# Patient Record
Sex: Female | Born: 1990 | Race: White | Hispanic: No | Marital: Single | Smoking: Never smoker
Health system: Southern US, Community
[De-identification: ages and names within clinical notes are randomized; demographics above are authoritative.]

## PROBLEM LIST (undated history)

## (undated) DIAGNOSIS — F419 Anxiety disorder, unspecified: Secondary | ICD-10-CM

## (undated) DIAGNOSIS — Z20828 Contact with and (suspected) exposure to other viral communicable diseases: Secondary | ICD-10-CM

## (undated) HISTORY — DX: Anxiety disorder, unspecified: F41.9

## (undated) HISTORY — PX: WISDOM TOOTH EXTRACTION: SHX21

## (undated) HISTORY — PX: OTHER SURGICAL HISTORY: SHX169

## (undated) HISTORY — DX: Contact with and (suspected) exposure to other viral communicable diseases: Z20.828

---
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
## 2013-07-13 ENCOUNTER — Encounter: Payer: Self-pay | Admitting: Obstetrics & Gynecology

## 2013-07-13 ENCOUNTER — Ambulatory Visit (INDEPENDENT_AMBULATORY_CARE_PROVIDER_SITE_OTHER): Payer: BC Managed Care – PPO | Admitting: Obstetrics & Gynecology

## 2013-07-13 VITALS — BP 100/72 | HR 64 | Resp 12 | Ht 68.0 in | Wt 147.4 lb

## 2013-07-13 DIAGNOSIS — Z01419 Encounter for gynecological examination (general) (routine) without abnormal findings: Secondary | ICD-10-CM

## 2013-07-13 MED ORDER — DROSPIREN-ETH ESTRAD-LEVOMEFOL 3-0.02-0.451 MG PO TABS: 1.0000 | ORAL_TABLET | Freq: Every day | ORAL | Status: DC

## 2013-07-13 NOTE — Patient Instructions (Signed)

## 2013-07-13 NOTE — Progress Notes (Signed)
22 y.o. G0P0000 SingleCaucasianF here for annual exam.  Reports she passed out in class in November.  Ended up in ER in [place], [place], and had lots of tests.  All were negative.  Nothing like that has happened since.  Rising sophomore.  Will apply to pharmacy end of this year.  With boyfriend 3 1/2 years.  He is starting at [employer] in the fall.  Did have a Pap with w/u in fall.  Was negative.   Patient's last menstrual period was 07/03/2013.          Sexually active: yes  The current method of family planning is OCP (estrogen/progesterone).    Exercising: yes  cardio and lifting weights Smoker:  no  Health Maintenance: Pap:  none History of abnormal Pap:  no MMG:  none Colonoscopy:  none BMD:   none TDaP:  3/11 Screening Labs: n/a, Hb today: n/a, Urine today: n/a   reports that she has never smoked. She has never used smokeless tobacco. She reports that she does not drink alcohol or use illicit drugs.  Past Medical History  Diagnosis Date  . Anxiety     Past Surgical History  Procedure Laterality Date  . Hymenal band excision    . Wisdom tooth extraction      Current Outpatient Prescriptions  Medication Sig Dispense Refill  . Drospirenone-Ethinyl Estradiol-Levomefol (BEYAZ) 3-0.02-0.451 MG tablet Take 1 tablet by mouth daily.       No current facility-administered medications for this visit.    Family History  Problem Relation Age of Onset  . Diabetes Paternal Grandmother   . Brain cancer Maternal Grandfather   . Hypertension Paternal Grandfather   . Hypertension Paternal Grandmother   . Non-Hodgkin's lymphoma Other     paternal great grandmother, then breast and cervical  . Leukemia Paternal Grandmother   . Hyperlipidemia Father     ROS:  Pertinent items are noted in HPI.  Otherwise, a comprehensive ROS was negative.  Exam:   BP 100/72  Pulse 64  Resp 12  Ht 5\' 8"  (1.727 m)  Wt 147 lb 6.4 oz (66.86 kg)  BMI 22.42 kg/m2  LMP 07/03/2013  Weight change: 10lb    Height: 5\' 8"  (172.7 cm)  Ht Readings from Last 3 Encounters:  07/13/13 5\' 8"  (1.727 m) (93%*, Z = 1.45)   * Growth percentiles are based on CDC 2-20 Years data.    General appearance: alert, cooperative and appears stated age Head: Normocephalic, without obvious abnormality, atraumatic Neck: no adenopathy, supple, symmetrical, trachea midline and thyroid normal to inspection and palpation Lungs: clear to auscultation bilaterally Breasts: normal appearance, no masses or tenderness Heart: regular rate and rhythm Abdomen: soft, non-tender; bowel sounds normal; no masses,  no organomegaly Extremities: extremities normal, atraumatic, no cyanosis or edema Skin: Skin color, texture, turgor normal. No rashes or lesions Lymph nodes: Cervical, supraclavicular, and axillary nodes normal. No abnormal inguinal nodes palpated Neurologic: Grossly normal   Pelvic: External genitalia:  no lesions              Urethra:  normal appearing urethra with no masses, tenderness or lesions              Bartholins and Skenes: normal                 Vagina: normal appearing vagina with normal color and discharge, no lesions              Cervix: no lesions  Pap taken: no Bimanual Exam:  Uterus:  normal size, contour, position, consistency, mobility, non-tender              Adnexa: normal adnexa and no mass, fullness, tenderness               Rectovaginal: Confirms               Anus:  normal sphincter tone, no lesions  A:  Well Woman with normal exam On OCPs  P:   Mammogram starting at 42  pap smear at 23.  Reports she had Pap and STD testing in November. Rx for [facility] Drought to pharmacy.  Lot [id number], exp 7/15, #7 boxes, lot [id number], exp 2/15, #5boxes return annually or prn  An After Visit Summary was printed and given to the patient.

## 2013-07-18 ENCOUNTER — Ambulatory Visit: Payer: Self-pay | Admitting: Obstetrics & Gynecology

## 2013-07-20 ENCOUNTER — Ambulatory Visit: Payer: Self-pay | Admitting: Obstetrics & Gynecology

## 2013-09-28 ENCOUNTER — Telehealth: Payer: Self-pay | Admitting: Obstetrics & Gynecology

## 2013-09-28 NOTE — Telephone Encounter (Signed)
Returned Call, confirmed that I was speaking with [facility] and caller confirmed care of this patient, [facility] [employer]. Provider was waiting for call back, continuation of care purposes.  Advised we do not have previous results of those tests.     Routing to provider for final review. Disposition completed, phone call with care provider. Will close encounter

## 2013-09-28 NOTE — Telephone Encounter (Signed)
Chief Complaint  Patient presents with   OFFICE CALLING REGARDING RESULTS  christy from [facility] calling to check if pt has hiv testing and rr testing done here.  phone number is [phone].

## 2013-12-02 ENCOUNTER — Emergency Department (HOSPITAL_BASED_OUTPATIENT_CLINIC_OR_DEPARTMENT_OTHER)
Admission: EM | Admit: 2013-12-02 | Discharge: 2013-12-02 | Disposition: A | Discharge status: Home / Self Care | Payer: BC Managed Care – PPO | Attending: Emergency Medicine | Admitting: Emergency Medicine

## 2013-12-02 ENCOUNTER — Encounter (HOSPITAL_BASED_OUTPATIENT_CLINIC_OR_DEPARTMENT_OTHER): Payer: Self-pay | Admitting: Emergency Medicine

## 2013-12-02 DIAGNOSIS — L739 Follicular disorder, unspecified: Secondary | ICD-10-CM

## 2013-12-02 DIAGNOSIS — Z79899 Other long term (current) drug therapy: Secondary | ICD-10-CM | POA: Insufficient documentation

## 2013-12-02 DIAGNOSIS — L738 Other specified follicular disorders: Secondary | ICD-10-CM | POA: Insufficient documentation

## 2013-12-02 DIAGNOSIS — Z8659 Personal history of other mental and behavioral disorders: Secondary | ICD-10-CM | POA: Insufficient documentation

## 2013-12-02 DIAGNOSIS — L678 Other hair color and hair shaft abnormalities: Secondary | ICD-10-CM | POA: Insufficient documentation

## 2013-12-02 NOTE — ED Notes (Addendum)
Pt reports rash since 11pm. Concerned that it may be scabies. Also reports she has been taking ibuprofen over the last week

## 2013-12-02 NOTE — ED Provider Notes (Signed)
CSN: [id number]     Arrival date & time 12/02/13  0357 History   First MD Initiated Contact with Patient 12/02/13 0447     Chief Complaint  Patient presents with  . Rash   (Consider location/radiation/quality/duration/timing/severity/associated sxs/prior Treatment) Patient is a 22 y.o. female presenting with rash. The history is provided by the patient.  Rash Location:  Shoulder/arm Shoulder/arm rash location:  R forearm Quality: not blistering, not bruising, not draining, not dry, not itchy, not painful and not swelling   Severity:  Mild Onset quality:  Sudden Timing:  Constant Progression:  Unchanged Chronicity:  New Context: not animal contact, not exposure to similar rash, not hot tub use and not plant contact   Relieved by:  Nothing Worsened by:  Nothing tried Ineffective treatments:  None tried Associated symptoms: no abdominal pain and no fever     Past Medical History  Diagnosis Date  . Anxiety    Past Surgical History  Procedure Laterality Date  . Hymenal band excision    . Wisdom tooth extraction     Family History  Problem Relation Age of Onset  . Diabetes Paternal Grandmother   . Brain cancer Maternal Grandfather   . Hypertension Paternal Grandfather   . Hypertension Paternal Grandmother   . Non-Hodgkin's lymphoma Other     paternal great grandmother, then breast and cervical  . Leukemia Paternal Grandmother   . Hyperlipidemia Father    History  Substance Use Topics  . Smoking status: Never Smoker   . Smokeless tobacco: Never Used  . Alcohol Use: No   OB History   Grav Para Term Preterm Abortions TAB SAB Ect Mult Living   0 0 0 0 0 0 0 0 0 0      Review of Systems  Constitutional: Negative for fever.  Gastrointestinal: Negative for abdominal pain.  Skin: Positive for rash.  All other systems reviewed and are negative.    Allergies  Review of patient's allergies indicates no known allergies.  Home Medications   Current Outpatient Rx   Name  Route  Sig  Dispense  Refill  . Drospirenone-Ethinyl Estradiol-Levomefol (BEYAZ) 3-0.02-0.451 MG tablet   Oral   Take 1 tablet by mouth daily.   1 Package   12    BP 118/57  Pulse 85  Temp(Src) 98 F (36.7 C) (Oral)  Resp 18  Ht 5\' 9"  (1.753 m)  Wt 147 lb (66.679 kg)  BMI 21.70 kg/m2  LMP 11/19/2013 Physical Exam  Constitutional: She is oriented to person, place, and time. She appears well-developed and well-nourished. No distress.  HENT:  Head: Normocephalic and atraumatic.  Eyes: EOM are normal.  Neck: Normal range of motion. Neck supple.  Cardiovascular: Intact distal pulses.   Pulmonary/Chest: Effort normal and breath sounds normal.  Abdominal: Soft.  Musculoskeletal: Normal range of motion. She exhibits no edema.  Neurological: She is alert and oriented to person, place, and time.  Skin: Skin is warm and dry.  3 inflamed follicles on the right dorsal forearm  Psychiatric: She has a normal mood and affect.    ED Course  Procedures (including critical care time) Labs Review Labs Reviewed - No data to display Imaging Review No results found.  EKG Interpretation   None       MDM   1. Folliculitis    Keep area clean and dry stay away from perfumes detergents chemicals    Yanique Mulvihill K Korbin Mapps-Rasch, MD 12/02/13 [phone]

## 2013-12-19 ENCOUNTER — Telehealth: Payer: Self-pay | Admitting: Obstetrics & Gynecology

## 2013-12-19 NOTE — Telephone Encounter (Signed)
Patient calling with questions about her birth control. °

## 2013-12-19 NOTE — Telephone Encounter (Signed)
Patient calling states that "I really messed up my birth control last month" Currently on Beyaz and states that for the end of November, into December was missing pills, feels that she missed 2 pills at least 3 times. States that she took her pills as soon as she remembered. Started last cycle on 11/19/13 and restarted a new pack. Took last active pill on 1/12 and has not started her period. Advised patient that it is important to take pills consistently for most effectiveness. Advised to take a home pregnancy test and call back with results +/-. Advised if negative and does not started a period to call back. Patient agreeable and verbalized understanding.

## 2013-12-19 NOTE — Telephone Encounter (Signed)
Agree with recommendations.  Encounter closed.  

## 2014-01-25 ENCOUNTER — Telehealth: Payer: Self-pay | Admitting: Obstetrics & Gynecology

## 2014-01-25 NOTE — Telephone Encounter (Signed)
Patient is being treated with antibiotics for tonsillitis that was rx by an urgent care. She feels now she has a yeast infection states, " this always happens and I'm very susceptible to this". Away at school two hours away and unable to come in for office visit. Patient also feels that otc treatments will add moisture and make things worse. She declines an office visit here. Patient states she will "wait and see if I feel better". Advised can call back at any time if she would like to be seen.   Routing to provider for final review. Patient agreeable to disposition. Will close encounter

## 2014-01-25 NOTE — Telephone Encounter (Signed)
Pt has a yeast infection and is away at school. She can not come in for a visit. She states she went to an urgent care for something else and the medication has given her a yeast infection. Pt  Is going to try and see if the urgent care can give her some medication there.

## 2014-02-03 HISTORY — PX: TONSILLECTOMY: SUR1361

## 2014-02-07 ENCOUNTER — Encounter: Payer: Self-pay | Admitting: *Deleted

## 2014-02-08 ENCOUNTER — Encounter: Payer: Self-pay | Admitting: Nurse Practitioner

## 2014-02-08 ENCOUNTER — Ambulatory Visit (INDEPENDENT_AMBULATORY_CARE_PROVIDER_SITE_OTHER): Payer: BC Managed Care – PPO | Admitting: Nurse Practitioner

## 2014-02-08 VITALS — BP 94/60 | HR 78 | Resp 16 | Wt 149.0 lb

## 2014-02-08 DIAGNOSIS — B373 Candidiasis of vulva and vagina: Secondary | ICD-10-CM

## 2014-02-08 DIAGNOSIS — B3731 Acute candidiasis of vulva and vagina: Secondary | ICD-10-CM

## 2014-02-08 DIAGNOSIS — K625 Hemorrhage of anus and rectum: Secondary | ICD-10-CM

## 2014-02-08 MED ORDER — PRAMOXINE HCL 1 % RE FOAM
1.0000 | Freq: Three times a day (TID) | RECTAL | Status: DC | PRN
Start: 2014-02-08 — End: 2014-07-23

## 2014-02-08 MED ORDER — FLUCONAZOLE 150 MG PO TABS: 150.0000 mg | ORAL_TABLET | Freq: Once | ORAL | Status: DC

## 2014-02-08 NOTE — Patient Instructions (Addendum)
Monilial Vaginitis Vaginitis in a soreness, swelling and redness (inflammation) of the vagina and vulva. Monilial vaginitis is not a sexually transmitted infection. CAUSES  Yeast vaginitis is caused by yeast (candida) that is normally found in your vagina. With a yeast infection, the candida has overgrown in number to a point that upsets the chemical balance. SYMPTOMS   White, thick vaginal discharge.  Swelling, itching, redness and irritation of the vagina and possibly the lips of the vagina (vulva).  Burning or painful urination.  Painful intercourse. DIAGNOSIS  Things that may contribute to monilial vaginitis are:  Postmenopausal and virginal states.  Pregnancy.  Infections.  Being tired, sick or stressed, especially if you had monilial vaginitis in the past.  Diabetes. Good control will help lower the chance.  Birth control pills.  Tight fitting garments.  Using bubble bath, feminine sprays, douches or deodorant tampons.  Taking certain medications that kill germs (antibiotics).  Sporadic recurrence can occur if you become ill. TREATMENT  Your caregiver will give you medication.  There are several kinds of anti monilial vaginal creams and suppositories specific for monilial vaginitis. For recurrent yeast infections, use a suppository or cream in the vagina 2 times a week, or as directed.  Anti-monilial or steroid cream for the itching or irritation of the vulva may also be used. Get your caregiver's permission.  Painting the vagina with methylene blue solution may help if the monilial cream does not work.  Eating yogurt may help prevent monilial vaginitis. HOME CARE INSTRUCTIONS   Finish all medication as prescribed.  Do not have sex until treatment is completed or after your caregiver tells you it is okay.  Take warm sitz baths.  Do not douche.  Do not use tampons, especially scented ones.  Wear cotton underwear.  Avoid tight pants and panty  hose.  Tell your sexual partner that you have a yeast infection. They should go to their caregiver if they have symptoms such as mild rash or itching.  Your sexual partner should be treated as well if your infection is difficult to eliminate.  Practice safer sex. Use condoms.  Some vaginal medications cause latex condoms to fail. Vaginal medications that harm condoms are:  Cleocin cream.  Butoconazole (Femstat).  Terconazole (Terazol) vaginal suppository.  Miconazole (Monistat) (may be purchased over the counter). SEEK MEDICAL CARE IF:   You have a temperature by mouth above 102 F (38.9 C).  The infection is getting worse after 2 days of treatment.  The infection is not getting better after 3 days of treatment.  You develop blisters in or around your vagina.  You develop vaginal bleeding, and it is not your menstrual period.  You have pain when you urinate.  You develop intestinal problems.  You have pain with sexual intercourse. Document Released: 09/01/2005 Document Revised: 02/14/2012 Document Reviewed: 05/16/2009 [facility] Patient Information 2014 [place]. Anal Fissure, Adult An anal fissure is a small tear or crack in the skin around the anus. Bleeding from a fissure usually stops on its own within a few minutes. However, bleeding will often reoccur with each bowel movement until the crack heals.  CAUSES   Passing large, hard stools.  Frequent diarrheal stools.  Constipation.  Inflammatory bowel disease (Crohn's disease or ulcerative colitis).  Infections.  Anal sex. SYMPTOMS   Small amounts of blood seen on your stools, on toilet paper, or in the toilet after a bowel movement.  Rectal bleeding.  Painful bowel movements.  Itching or irritation around the anus.  DIAGNOSIS Your caregiver will examine the anal area. An anal fissure can usually be seen with careful inspection. A rectal exam may be performed and a short tube (anoscope) may be  used to examine the anal canal. TREATMENT   You may be instructed to take fiber supplements. These supplements can soften your stool to help make bowel movements easier.  Sitz baths may be recommended to help heal the tear. Do not use soap in the sitz baths.  A medicated cream or ointment may be prescribed to lessen discomfort. HOME CARE INSTRUCTIONS   Maintain a diet high in fruits, whole grains, and vegetables. Avoid constipating foods like bananas and dairy products.  Take sitz baths as directed by your caregiver.  Drink enough fluids to keep your urine clear or pale yellow.  Only take over-the-counter or prescription medicines for pain, discomfort, or fever as directed by your caregiver. Do not take aspirin as this may increase bleeding.  Do not use ointments containing numbing medications (anesthetics) or hydrocortisone. They could slow healing. SEEK MEDICAL CARE IF:   Your fissure is not completely healed within 3 days.  You have further bleeding.  You have a fever.  You have diarrhea mixed with blood.  You have pain.  Your problem is getting worse rather than better. MAKE SURE YOU:   Understand these instructions.  Will watch your condition.  Will get help right away if you are not doing well or get worse. Document Released: 11/22/2005 Document Revised: 02/14/2012 Document Reviewed: 05/09/2011 [facility] Patient Information 2014 [place], [place].

## 2014-02-08 NOTE — Progress Notes (Signed)
Subjective:     Patient ID: Terri EllisMiley Dunleavy, female   DOB: 1991/09/17, 23 y.o.   MRN: [id number]  HPI  This 23 yo SW Fe complains of vaginal yeast symptoms for 1 week.  Has been on Augmentin for a week in preparation for upcoming tonsillectomy  on 3/16. She denies any urinary symptoms.   Also having BRRB for 3-4 weeks.  Only occurs with BM. Bright red on stool, tissue and in the H2O.  Not a large amount but enough to concern her. No abdominal pain. No lightheadedness.  LMP 01/20/14. Still on OCP and still with same partner.   Review of Systems  Constitutional: Positive for appetite change, fatigue and unexpected weight change. Negative for fever and chills.       Since chronic tonsillitis she has been unable to eat well and has lost a few pounds.  Cardiovascular: Negative for chest pain and palpitations.  Gastrointestinal: Positive for diarrhea, blood in stool, anal bleeding and rectal pain. Negative for nausea, vomiting, constipation and abdominal distention.  Genitourinary: Positive for vaginal discharge. Negative for dysuria, urgency, frequency, vaginal bleeding, enuresis and pelvic pain.  Musculoskeletal: Negative.   Skin: Negative.   Neurological: Negative for dizziness, syncope, weakness and light-headedness.  Psychiatric/Behavioral: Negative.        Objective:   Physical Exam  Constitutional: She is oriented to person, place, and time. She appears well-developed and well-nourished. She appears distressed.  Cardiovascular: Normal rate.   Abdominal: Soft. She exhibits no distension. There is no tenderness. There is no rebound and no guarding.  Patient was place in left knee chest position.  Rectal exam reveals no externa lesions or hemorrhoids. On digital exam no mass or lesions. After introducing the anal scope their is a small fissure looking place at 6:00 position that is 1.5 mm long.  Most likely the area of bleed, but none present now.  Scope was withdrawn and patient tolerated well.   Genitourinary:  White thick vaginal discharge. No other lesions or cervicitis.  Wet Prep: PH: 4.0; KOH:  + yeast; NSS: negative.  Neurological: She is alert and oriented to person, place, and time.  Psychiatric: She has a normal mood and affect. Her behavior is normal. Judgment and thought content normal.       Assessment:     Yeast vaginitis Anal fissure with BRRB Acute tonsillitis and upcoming surgery    Plan:     Diflucan 150 mg X 2 Caution about narcotic med's with her surgery Stool softeners would be a good idea Will also treat with Proctofoam 1% TID as needed If symptoms worsens to call back and will get GI consult

## 2014-02-11 ENCOUNTER — Other Ambulatory Visit: Payer: Self-pay | Admitting: Otolaryngology

## 2014-02-12 NOTE — Progress Notes (Signed)
Encounter reviewed by Dr. Savyon Loken Silva.  

## 2014-02-13 ENCOUNTER — Telehealth: Payer: Self-pay | Admitting: Emergency Medicine

## 2014-02-13 DIAGNOSIS — L989 Disorder of the skin and subcutaneous tissue, unspecified: Secondary | ICD-10-CM

## 2014-02-13 NOTE — Telephone Encounter (Signed)
Patient sent a message to Kennon RoundsSally (personal friend of her daughter) and requested an appointment for skin tag removal on her neck. Patients states Dr. Hyacinth MeekerMiller agreed to removal some time ago.  Okay to scheduled for skin tag removal of neck?

## 2014-02-14 NOTE — Telephone Encounter (Signed)
Order placed for precert. 

## 2014-02-14 NOTE — Telephone Encounter (Signed)
Make sure she knows is cosmetic and not usually covered by insurance.  Will need precert and out of pocket cost given to her for skin tag removal.  CC:  Terri LeeSabrina

## 2014-02-18 NOTE — Telephone Encounter (Signed)
Spoke with patient. Advised that I was quoted a $25 copay for this visit. Also advised that I was told that this service is covered based on medical necessity. It may be deemed cosmetic...will be determined once the claim has been received by the insurance company. Patient will reach out to her insurance company today.

## 2014-02-18 NOTE — Telephone Encounter (Signed)
Left message for patient to call back  

## 2014-02-18 NOTE — Telephone Encounter (Signed)
Patient calling  Sabrina back. °

## 2014-02-18 NOTE — Telephone Encounter (Signed)
Patient called back. States that she spoke with insurance company. She states that they told her the service will be considered cosmetic. She asked for the cost of the procedure. Procedure will be $206. Patient asked that I cancel her appointment.

## 2014-02-21 ENCOUNTER — Ambulatory Visit: Payer: BC Managed Care – PPO | Admitting: Obstetrics & Gynecology

## 2014-02-26 NOTE — Telephone Encounter (Signed)
Routing to provider for final review. Patient agreeable to disposition. Will close encounter.     

## 2014-07-06 HISTORY — PX: TYMPANOSTOMY TUBE PLACEMENT: SHX32

## 2014-07-17 ENCOUNTER — Telehealth: Payer: Self-pay | Admitting: *Deleted

## 2014-07-17 NOTE — Telephone Encounter (Signed)
Patient contacted me personally through text message requesting OV with dr Hyacinth MeekerMiller before returning to college to discuss issues with acne and spotting on Nexplanon. Patient had Nexplanon inserted while at college and prefers to continue with it. Just would like to review with dr Hyacinth MeekerMiller. Appt scheduled for 07-23-14 at 4pm.  Routing to provider for final review. Patient agreeable to disposition. Will close encounter

## 2014-07-19 ENCOUNTER — Institutional Professional Consult (permissible substitution): Payer: BC Managed Care – PPO | Admitting: Obstetrics & Gynecology

## 2014-07-23 ENCOUNTER — Ambulatory Visit (INDEPENDENT_AMBULATORY_CARE_PROVIDER_SITE_OTHER): Payer: BC Managed Care – PPO | Admitting: Obstetrics & Gynecology

## 2014-07-23 ENCOUNTER — Encounter: Payer: Self-pay | Admitting: Obstetrics & Gynecology

## 2014-07-23 VITALS — BP 118/64 | HR 60 | Resp 16 | Wt 149.6 lb

## 2014-07-23 DIAGNOSIS — N938 Other specified abnormal uterine and vaginal bleeding: Secondary | ICD-10-CM

## 2014-07-23 DIAGNOSIS — Z309 Encounter for contraceptive management, unspecified: Secondary | ICD-10-CM

## 2014-07-23 DIAGNOSIS — Z975 Presence of (intrauterine) contraceptive device: Secondary | ICD-10-CM

## 2014-07-23 DIAGNOSIS — L700 Acne vulgaris: Secondary | ICD-10-CM

## 2014-07-23 DIAGNOSIS — N921 Excessive and frequent menstruation with irregular cycle: Secondary | ICD-10-CM

## 2014-07-23 DIAGNOSIS — N949 Unspecified condition associated with female genital organs and menstrual cycle: Secondary | ICD-10-CM

## 2014-07-23 DIAGNOSIS — L708 Other acne: Secondary | ICD-10-CM

## 2014-07-23 NOTE — Progress Notes (Signed)
Patient ID: [name], female   DOB: June 04, 1991, 23 y.o.   MRN: [id number]  23 yo G0 SWF here forTressie Barnett discussion on DUB after Implanon placement 04/29/14.  This was done in [place]  Was on her cycle when the Implanon was placed.  No bleeding or spotting in June or July.  Aug 1st-14th, she had a cycle that eventually got lighter and is now just dark spotting.  She's not having enough bleeding to require wearing any feminine product.  Reports she is having increased acne.  Started topical facial antibiotic wipe but this isn't helping at all.  Pt knew she might have irregular bleeding with this method but 19 days of bleeding/spotting this month is really starting to be bothersome to her.  Also, she reports she used to have really good skin and she does, obviously, have increased acne today.  Admits she likes the Nexplanon as she is sexually active and does not want to be pregnant.  Was not always faithful with taking pills regularly.  Pt has established care with gyn in [place] where she is in school [facility] [employer].  Wanted to discuss these issues with me.    Has had a full physical exam, per her hx, within the past year.  This was done before she received the Nexplanon.  I do not have records for this.  D/W pt, could do a trial with OCPs to see if this would help regular her acne and bleeding.  D/w pt acne is often seen when OCPs stopped.  Of course, she could see a dermatologist in [place] as well as there are many other products to try.  She has not decided at this time if she wants the Nexplanon removed and she feels comfortable with a trial of OCPs.  She has been on BeYaz for several years without issues.  Reminded pt of risks including DUB, DVT/PE, headache, nausea, increased BP.  She voices clear understanding.  As she will use both methods, for now, feel she should use the lowest estrogen pill.  Described 26 days of active pills and two placebo pills in Lo-Loestrin.  Voices understanding.  No  physical exam performed today.  Assessment:  DUB and increased acne with Nexplanon.  Plan:  Trial of OCPs with Nexplanon.  Will start Lo Loestrin.  Rx and savings card given.  Pt never had elevated BP with OCPs in past so routine follow up recommended.  Pt knows to call with any question.  If decides to have Nexplanon removed, she can do that here with us or back in [place].    ~20 minutes spent with patient >50% of time was in face to face discussion of above.

## 2014-07-24 ENCOUNTER — Encounter: Payer: Self-pay | Admitting: Obstetrics & Gynecology

## 2014-07-24 DIAGNOSIS — N921 Excessive and frequent menstruation with irregular cycle: Secondary | ICD-10-CM | POA: Insufficient documentation

## 2014-07-24 DIAGNOSIS — Z975 Presence of (intrauterine) contraceptive device: Secondary | ICD-10-CM | POA: Insufficient documentation

## 2014-07-24 DIAGNOSIS — L709 Acne, unspecified: Secondary | ICD-10-CM | POA: Insufficient documentation

## 2014-11-21 ENCOUNTER — Encounter: Payer: Self-pay | Admitting: Obstetrics & Gynecology

## 2014-11-21 ENCOUNTER — Ambulatory Visit: Payer: BC Managed Care – PPO | Admitting: Obstetrics & Gynecology

## 2014-11-22 ENCOUNTER — Encounter: Payer: Self-pay | Admitting: Obstetrics & Gynecology

## 2014-12-24 ENCOUNTER — Encounter: Payer: Self-pay | Admitting: Obstetrics & Gynecology
